# Patient Record
Sex: Female | Born: 1945 | Race: White | Hispanic: No | Marital: Married | Smoking: Never smoker
Health system: Southern US, Community
[De-identification: ages and names within clinical notes are randomized; demographics above are authoritative.]

## PROBLEM LIST (undated history)

## (undated) DIAGNOSIS — H269 Unspecified cataract: Secondary | ICD-10-CM

## (undated) DIAGNOSIS — F419 Anxiety disorder, unspecified: Secondary | ICD-10-CM

## (undated) HISTORY — PX: BLADDER SURGERY: SHX569

## (undated) HISTORY — PX: CHOLECYSTECTOMY: SHX55

## (undated) HISTORY — PX: REPLACEMENT TOTAL KNEE: SUR1224

## (undated) HISTORY — PX: RECTOCELE REPAIR: SHX761

## (undated) HISTORY — PX: APPENDECTOMY: SHX54

## (undated) HISTORY — PX: ULNAR NERVE REPAIR: SHX2594

## (undated) HISTORY — PX: OTHER SURGICAL HISTORY: SHX169

## (undated) HISTORY — DX: Anxiety disorder, unspecified: F41.9

## (undated) HISTORY — PX: HYSTERECTOMY ABDOMINAL WITH SALPINGECTOMY: SHX6725

## (undated) HISTORY — PX: CARPAL TUNNEL RELEASE: SHX101

## (undated) HISTORY — PX: HAND TENDON SURGERY: SHX663

## (undated) HISTORY — PX: TONSILLECTOMY: SUR1361

## (undated) HISTORY — DX: Unspecified cataract: H26.9

---

## 2015-12-03 ENCOUNTER — Ambulatory Visit (INDEPENDENT_AMBULATORY_CARE_PROVIDER_SITE_OTHER): Payer: Medicare Other | Admitting: Gastroenterology

## 2015-12-03 ENCOUNTER — Telehealth: Payer: Self-pay | Admitting: Gastroenterology

## 2015-12-03 ENCOUNTER — Other Ambulatory Visit: Payer: Self-pay

## 2015-12-03 ENCOUNTER — Encounter: Payer: Self-pay | Admitting: Gastroenterology

## 2015-12-03 DIAGNOSIS — IMO0002 Reserved for concepts with insufficient information to code with codable children: Secondary | ICD-10-CM

## 2015-12-03 DIAGNOSIS — M25559 Pain in unspecified hip: Secondary | ICD-10-CM

## 2015-12-03 DIAGNOSIS — R1032 Left lower quadrant pain: Secondary | ICD-10-CM

## 2015-12-03 DIAGNOSIS — N816 Rectocele: Secondary | ICD-10-CM

## 2015-12-03 NOTE — Telephone Encounter (Signed)
Pt called this afternoon asking if her insurance would cover her CT scan that's scheduled for 9/26. Please call [phone]

## 2015-12-03 NOTE — Progress Notes (Signed)
cc'ed to pcp °

## 2015-12-03 NOTE — Assessment & Plan Note (Addendum)
MOST LIKELY ASSOCIATED WITH CONSTIPATION AND PELVIC LAXITY,LESS LIKELY OCCULT MALIGNANCY.  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  DRINK WATER TO KEEP YOUR URINE LIGHT YELLOW.   FOLLOW A HIGH FIBER DIET. AVOID ITEMS THAT CAUSE BLOATING & GAS. HANDOUT GIVEN.  ADD LINZESS DAILY WITH BREAKFAST. IT MAY CAUSE EXPLOSIVE DIARRHEA.  COMPLETE CT SCAN.   OBTAIN MRI REPORT AND TCS REPORT.  FOLLOW UP IN 3 MOS.

## 2015-12-03 NOTE — Patient Instructions (Addendum)
Complete appointment at [facility].   DRINK WATER TO KEEP YOUR URINE LIGHT YELLOW.   FOLLOW A HIGH FIBER DIET. AVOID ITEMS THAT CAUSE BLOATING & GAS. SEE INFO BELOW.  ADD LINZESS DAILY WITH BREAKFAST. IT MAY CAUSE EXPLOSIVE DIARRHEA.  PLEASE CALL IN 7 DAYS IF YOUR SYMPTOMS ARE NOT IMPROVED.   COMPLETE CT SCAN.  FOLLOW UP IN 3 MOS.   High-Fiber Diet A high-fiber diet changes your normal diet to include more whole grains, legumes, fruits, and vegetables. Changes in the diet involve replacing refined carbohydrates with unrefined foods. The calorie level of the diet is essentially unchanged. The Dietary Reference Intake (recommended amount) for adult males is 38 grams per day. For adult females, it is 25 grams per day. Pregnant and lactating women should consume 28 grams of fiber per day. Fiber is the intact part of a plant that is not broken down during digestion. Functional fiber is fiber that has been isolated from the plant to provide a beneficial effect in the body. PURPOSE  Increase stool bulk.   Ease and regulate bowel movements.   Lower cholesterol.  INDICATIONS THAT YOU NEED MORE FIBER  Constipation and hemorrhoids.   Uncomplicated diverticulosis (intestine condition) and irritable bowel syndrome.   Weight management.   As a protective measure against hardening of the arteries (atherosclerosis), diabetes, and cancer.   GUIDELINES FOR INCREASING FIBER IN THE DIET  Start adding fiber to the diet slowly. A gradual increase of about 5 more grams (2 slices of whole-wheat bread, 2 servings of most fruits or vegetables, or 1 bowl of high-fiber cereal) per day is best. Too rapid an increase in fiber may result in constipation, flatulence, and bloating.   Drink enough water and fluids to keep your urine clear or pale yellow. Water, juice, or caffeine-free drinks are recommended. Not drinking enough fluid may cause constipation.   Eat a variety of high-fiber foods rather  than one type of fiber.   Try to increase your intake of fiber through using high-fiber foods rather than fiber pills or supplements that contain small amounts of fiber.   The goal is to change the types of food eaten. Do not supplement your present diet with high-fiber foods, but replace foods in your present diet.  INCLUDE A VARIETY OF FIBER SOURCES  Replace refined and processed grains with whole grains, canned fruits with fresh fruits, and incorporate other fiber sources. White rice, white breads, and most bakery goods contain little or no fiber.   Brown whole-grain rice, buckwheat oats, and many fruits and vegetables are all good sources of fiber. These include: broccoli, Brussels sprouts, cabbage, cauliflower, beets, sweet potatoes, white potatoes (skin on), carrots, tomatoes, eggplant, squash, berries, fresh fruits, and dried fruits.   Cereals appear to be the richest source of fiber. Cereal fiber is found in whole grains and bran. Bran is the fiber-rich outer coat of cereal grain, which is largely removed in refining. In whole-grain cereals, the bran remains. In breakfast cereals, the largest amount of fiber is found in those with "bran" in their names. The fiber content is sometimes indicated on the label.   You may need to include additional fruits and vegetables each day.   In baking, for 1 cup white flour, you may use the following substitutions:   1 cup whole-wheat flour minus 2 tablespoons.   1/2 cup white flour plus 1/2 cup whole-wheat flour.

## 2015-12-03 NOTE — Telephone Encounter (Signed)
Called pt and informed pre-authorization isn't needed for Medicare, but she may have to meet her deductible for year. She stated she thinks she has met her deductible.

## 2015-12-03 NOTE — Progress Notes (Signed)
Subjective:    Patient ID: Mary Bruce, female    DOB: [birth year], 70 y.o.   MRN: [id number] [name], MD  HPI Hurts in left POSTERIOR PELVIS. STARTS IN THE MIDDLE AND GOES TO LEFT SIDE AND THEN DOWN INTO HER RECTUM. MAY HAVE LLQ DISCOMFORT AS WELL. MAY BE TENDER IN LLQ. IF SHE HAS A GOOD BM SHE HAS PRESSURE ON HER POSTERIOR PELVIS. LAST COLONOSCOPY 3 YEARS: DR. Sabino Dick AND HAS HAD DIVERTICULITIS. DOESN'T FEEL LIKE SHE EMPTIES. HAS A LOT OF PRESSURE. PRESSURE HAPPENS EVERY DAY FOR 3 MOS. PCP PUT HER ON PREDNISONE AND AS SOON AS IT WEANED OFF THEN IT CAME BACK. DID MRI OF BACK IN [place] AND SAW BACK DOCTOR. NOT HAVING TROUBLE WITH THIS BACK PAIN AT THE TIME OF TCS, NO KNOWN HISTORY OF HEMORRHOIDS. PROBLEMS WITH SEDATION: HARD TO WAKE UP.  MEDS FOR CONSTIPATION: MIRALAX, SENNA, ??MEDS CAUSING BLOATING-NOW OFF MARKET  PT DENIES FEVER, CHILLS, HEMATOCHEZIA, HEMATEMESIS, nausea, vomiting, melena, diarrhea, CHEST PAIN, SHORTNESS OF BREATH,  CHANGE IN BOWEL IN HABITS, problems swallowing, OR heartburn or indigestion.  Past Medical History:  Diagnosis Date  . Anxiety disorder    AGE 70    Past Surgical History:  Procedure Laterality Date  . APPENDECTOMY     EMPIRIC  . BLADDER SURGERY     "BLADDER TACK"  . CARPAL TUNNEL RELEASE Right   . CHOLECYSTECTOMY     GALLSTINES  . HAND TENDON SURGERY Right   . HYSTERECTOMY ABDOMINAL WITH SALPINGECTOMY    . KIDNEY STONE    . RECTOCELE REPAIR    . REPLACEMENT TOTAL KNEE Right   . TONSILLECTOMY    . ULNAR NERVE REPAIR Right    Allergies  Allergen Reactions  . Flagyl [Metronidazole] Other (See Comments)    TONGUE FELT FUNNY  . Levaquin [Levofloxacin In D5w]     TONGUE FELT FUNNY  . Oxycodone Other (See Comments)    HALLUCINATIONS-SEEING BUGS  . Librax [Chlordiazepoxide-Clidinium] Rash    ON FACE    Current Outpatient Prescriptions  Medication Sig Dispense Refill  . ALPRAZolam (XANAX) 0.25 MG tablet Take 1  tablet by mouth as needed.    . calcium citrate-vitamin D (CITRACAL+D) 315-200 MG-UNIT tablet Take 2 tablets by mouth daily.    Marland Kitchen conjugated estrogens (PREMARIN) vaginal cream 1 Applicatorful 2 (two) times a week.    . Cyanocobalamin (VITAMIN B 12) 100 MCG LOZG Take 1 tablet by mouth daily.    Marland Kitchen estradiol (VIVELLE-DOT) 0.05 MG/24HR patch Place 1 patch onto the skin 2 (two) times a week.    Marland Kitchen ibuprofen (ADVIL,MOTRIN) 200 MG tablet Take 2 tablets by mouth as needed.    Marland Kitchen omega-3 fish oil (MAXEPA) 1000 MG CAPS capsule Take 1 capsule by mouth daily.    Marland Kitchen PARoxetine (PAXIL) 10 MG tablet Take 2 tablets by mouth daily.      Family History  Problem Relation Age of Onset  . Colon polyps Son   NO COLON CANCER  Social History   Social History  . Marital status: Married    Spouse name: N/A  . Number of children: N/A  . Years of education: N/A   Social History Main Topics  . Smoking status: Never Smoker  . Smokeless tobacco: Never Used  . Alcohol use None  . Drug use: Unknown  . Sexual activity: Not Asked   Review of Systems PER HPI OTHERWISE ALL SYSTEMS ARE NEGATIVE.     Objective:   Physical Exam  Constitutional:  She is oriented to person, place, and time. She appears well-developed and well-nourished. No distress.  HENT:  Head: Normocephalic and atraumatic.  Mouth/Throat: Oropharynx is clear and moist. No oropharyngeal exudate.  Eyes: Pupils are equal, round, and reactive to light. No scleral icterus.  Neck: Normal range of motion. Neck supple.  Cardiovascular: Normal rate, regular rhythm and normal heart sounds.   Pulmonary/Chest: Effort normal and breath sounds normal. No respiratory distress.  Abdominal: Soft. Bowel sounds are normal. She exhibits no distension. There is no tenderness.  Musculoskeletal: She exhibits no edema.  Lymphadenopathy:    She has no cervical adenopathy.  Neurological: She is alert and oriented to person, place, and time.  NO FOCAL DEFICITS    Psychiatric: She has a normal mood and affect.  Vitals reviewed.     Assessment & Plan:

## 2015-12-03 NOTE — Progress Notes (Signed)
ON RECALL  °

## 2015-12-04 LAB — CREATININE, SERUM: Creat: 0.74 mg/dL (ref 0.60–0.93)

## 2015-12-08 ENCOUNTER — Ambulatory Visit (HOSPITAL_COMMUNITY)
Admission: RE | Admit: 2015-12-08 | Discharge: 2015-12-08 | Disposition: A | Discharge status: Home / Self Care | Payer: Medicare Other | Source: Ambulatory Visit | Attending: Gastroenterology | Admitting: Gastroenterology

## 2015-12-08 ENCOUNTER — Encounter (HOSPITAL_COMMUNITY): Payer: Self-pay | Admitting: Radiology

## 2015-12-08 DIAGNOSIS — K5730 Diverticulosis of large intestine without perforation or abscess without bleeding: Secondary | ICD-10-CM | POA: Diagnosis not present

## 2015-12-08 DIAGNOSIS — N8189 Other female genital prolapse: Secondary | ICD-10-CM | POA: Diagnosis not present

## 2015-12-08 DIAGNOSIS — K838 Other specified diseases of biliary tract: Secondary | ICD-10-CM | POA: Diagnosis not present

## 2015-12-08 DIAGNOSIS — N8110 Cystocele, unspecified: Secondary | ICD-10-CM | POA: Diagnosis not present

## 2015-12-08 DIAGNOSIS — R1032 Left lower quadrant pain: Secondary | ICD-10-CM | POA: Insufficient documentation

## 2015-12-08 MED ORDER — IOPAMIDOL (ISOVUE-300) INJECTION 61%
100.0000 mL | Freq: Once | INTRAVENOUS | Status: AC | PRN
  Administered 2015-12-08: 100 mL via INTRAVENOUS

## 2015-12-10 ENCOUNTER — Telehealth: Payer: Self-pay | Admitting: Gastroenterology

## 2015-12-10 DIAGNOSIS — K838 Other specified diseases of biliary tract: Secondary | ICD-10-CM

## 2015-12-10 NOTE — Telephone Encounter (Signed)
Pt called to see if her CT results are back yet and she has concerns about the Urologist in [place] and wanted to speak with SF to get reassurance that he was the best urologist in [place]. Please call [phone] or [phone]

## 2015-12-11 NOTE — Addendum Note (Signed)
Addended by: West BaliFIELDS, Edie Vallandingham L on: 12/11/2015 12:39 PM   Modules accepted: Orders

## 2015-12-11 NOTE — Telephone Encounter (Addendum)
Called patient TO DISCUSS RESULTS. EXPLAINED TO HUSBAND. 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 PT SHOULD OPV IN OCT 9 130 PM WITH DR. MACDIARMID. HUSBAND AWARE OF ASSESSMENT AND PLAN. EXPLAINED PELVIC DYSFUNCTION, PROBLEMS WITH PELVIC LAXITY, AND POTENTIAL BENEFIT OF PHYSICAL THERAPY +/= SURGERY TO IMPROVE THE PT'S QUALITY OF LIFE/BOWEL MOVEMENTS. PT MAY CALL ON MON WITH QUESTIONS OR CONCERNS. NEED HFP TO FOLLOW UP DILATED CBD. HE IS AWARE IT'S NOT UNCOMMON TO SEE IF PT HAS HAD A GB REMOVED.   FAX HFP ORDER TO LAB.

## 2015-12-11 NOTE — Telephone Encounter (Signed)
I called pt and she is aware we have 7-10 business days to call on results. Her CT was done on 12/08/2015.  I told her not to worry that Dr. Darrick PennaFields would only send her to the best doctors and the Urologist group that she has been referred to is one of the very best. She said she felt better and she is sorry that she even questioned it, but she has just been worried. I told her Dr. Darrick PennaFields understands and no problem that she asked.  She said she felt much better since I had called her and she is fine now with the referral.

## 2015-12-14 ENCOUNTER — Other Ambulatory Visit: Payer: Self-pay

## 2015-12-14 DIAGNOSIS — K838 Other specified diseases of biliary tract: Secondary | ICD-10-CM

## 2015-12-15 ENCOUNTER — Telehealth: Payer: Self-pay

## 2015-12-15 MED ORDER — LINACLOTIDE 145 MCG PO CAPS: ORAL_CAPSULE | ORAL | 11 refills | Status: DC

## 2015-12-15 NOTE — Telephone Encounter (Signed)
Pt's husband is aware for pt to get the lab work. He is aware the order has been faxed to [facility] and they will try to do today or tomorrow.

## 2015-12-15 NOTE — Telephone Encounter (Signed)
PLEASE CALL PT.  Rx sent.  

## 2015-12-15 NOTE — Telephone Encounter (Signed)
Pt is aware.  

## 2015-12-15 NOTE — Telephone Encounter (Signed)
Pt called and said the Linzess is working well ( she thinks it was 145 mcg).  She would like a prescription sent to [facility] in [place].

## 2015-12-16 LAB — HEPATIC FUNCTION PANEL
ALBUMIN: 3.8 g/dL (ref 3.6–5.1)
ALK PHOS: 48 U/L (ref 33–130)
ALT: 11 U/L (ref 6–29)
AST: 16 U/L (ref 10–35)
Bilirubin, Direct: 0.1 mg/dL (ref <=0.2)
Indirect Bilirubin: 0.5 mg/dL (ref 0.2–1.2)
Total Bilirubin: 0.6 mg/dL (ref 0.2–1.2)
Total Protein: 5.8 g/dL — ABNORMAL LOW (ref 6.1–8.1)

## 2015-12-23 ENCOUNTER — Telehealth: Payer: Self-pay | Admitting: Gastroenterology

## 2015-12-23 NOTE — Telephone Encounter (Signed)
PLEASE CALL PT. HER LIVER PANEL IS ESSENTIALLY NORMAL.HER TOTAL PROTEIN IS LOW. SHE SHOULD SEE HER PCP TO CHECK HER URINE.

## 2015-12-23 NOTE — Telephone Encounter (Signed)
Pt is aware results. She has had her urine checked

## 2015-12-24 NOTE — Telephone Encounter (Signed)
Called pt. She is agreeable to appt to discuss TCS. Appt made for 01/07/16 at 9:00 am with [facility].

## 2015-12-24 NOTE — Telephone Encounter (Signed)
PLEASE CALL PT. IF SHE WOULD LIKE TO COME SEE ME TO TALK ABOUT THE BENEFITS V. RISKS OF A COLONOSCOPY THEN WE CAN MAKE AN APPT.

## 2015-12-24 NOTE — Telephone Encounter (Signed)
Called pt to set-up TCS but she advised she had a colonoscopy 3-4 years ago in [place] done by Dr. Elder CyphersShiflett who has since retired. Her PCP in [place] (Dr. Loni DollyJannach) at [facility] has her records. She has a colonoscopy every 5 years due to her son having hx of polyps. She said if she needs another colonoscopy now then she is willing to do it. She had her urine checked 12/21/15 when she went to see Dr. Sherron MondayMacdiarmid.

## 2015-12-24 NOTE — Telephone Encounter (Signed)
PLEASE CALL PT. I REVIEWED NOTES FROM UROLOGY. DR. Sherron MondayMACDIARMID RECOMMENDED A COLONOSCOPY. WE WILL GET HER SCHEDULED IN [place].   FULL LIQUIDS DIET DAY PRIOR TO TCS. CONTINUE LINZESS. TCS W/ MAC DUE TO POLYPHARMACY, Dx: RECTAL PAIN. IF NO SOURCE FOR HER RECTAL PAIN CAN BE IDENTIFIED THEN SHE WILL NEED TO SEE A ORTHOPEDIC SURGEON.

## 2016-01-07 ENCOUNTER — Ambulatory Visit (INDEPENDENT_AMBULATORY_CARE_PROVIDER_SITE_OTHER): Payer: Medicare Other | Admitting: Gastroenterology

## 2016-01-07 ENCOUNTER — Encounter: Payer: Self-pay | Admitting: Gastroenterology

## 2016-01-07 VITALS — BP 113/67 | HR 72 | Temp 97.8°F | Ht 64.0 in | Wt 172.4 lb

## 2016-01-07 DIAGNOSIS — K5901 Slow transit constipation: Secondary | ICD-10-CM

## 2016-01-07 DIAGNOSIS — R1032 Left lower quadrant pain: Secondary | ICD-10-CM | POA: Diagnosis not present

## 2016-01-07 MED ORDER — LINACLOTIDE 145 MCG PO CAPS: ORAL_CAPSULE | ORAL | 3 refills | Status: DC

## 2016-01-07 NOTE — Progress Notes (Signed)
ON RECALL  °

## 2016-01-07 NOTE — Progress Notes (Signed)
Subjective:    Patient ID: Mary Bruce, female    DOB: 11-15-1945, 70 y.o.   MRN: [id number]  Mary Deem, MD   HPI SAW UROLOGY AND NO INTERVENTION PLANNED. SAW DR. Jearl Bruce CHIROPRACTOR AND RECTAL PAIN IS BETTER WITH MS MANIPULATION. EXTENSIVE GI WORKUP-NO CAUSE FOR RECTAL PAIN. RECENT TCS 2014. HAS PMHx: POLYPS BUT NEEDS TCS Q5 YEARS DUE TO FAMILY HISTORY AND PERSONAL HISTORY OF POLYPS. CONSTIPATION IS BETTER. BM Q2-3 DAYS BUT NO STRAINING. LINZESS 145 MCG WORKS FOR CONSTIPATION. INTERESTED IN HIGHER DOSE IF IT WOULD BE MOR EFFFECTIVE. LLQ PAIN IS BETTER AFTER CHIROPRACTOR AND LINZESS.  PT DENIES FEVER, CHILLS, HEMATOCHEZIA, nausea, vomiting, diarrhea, CHEST PAIN, SHORTNESS OF BREATH,  abdominal pain, problems swallowing, problems with sedation, heartburn or indigestion.  Past Medical History:  Diagnosis Date  . Anxiety disorder    AGE 85  . Cataracts, bilateral    Past Surgical History:  Procedure Laterality Date  . APPENDECTOMY     EMPIRIC  . BLADDER SURGERY     "BLADDER TACK"  . CARPAL TUNNEL RELEASE Right   . CHOLECYSTECTOMY     GALLSTINES  . HAND TENDON SURGERY Right   . HYSTERECTOMY ABDOMINAL WITH SALPINGECTOMY     DUE TO "DROPPPED BLADDER"  . KIDNEY STONE    . RECTOCELE REPAIR    . REPLACEMENT TOTAL KNEE Right   . TONSILLECTOMY    . ULNAR NERVE REPAIR Right     Allergies  Allergen Reactions  . Flagyl [Metronidazole] Other (See Comments)    TONGUE FELT FUNNY  . Levaquin [Levofloxacin In D5w]     TONGUE FELT FUNNY  . Oxycodone Other (See Comments)    HALLUCINATIONS-SEEING BUGS  . Librax [Chlordiazepoxide-Clidinium] Rash    ON FACE    Current Outpatient Prescriptions  Medication Sig Dispense Refill  . ALPRAZolam (XANAX) 0.25 MG tablet Take 1 tablet by mouth as needed.    . calcium citrate-vitamin D (CITRACAL+D) 315-200 MG-UNIT tablet Take 2 tablets by mouth daily.    Marland Kitchen conjugated estrogens (PREMARIN) vaginal cream 1 Applicatorful 2 (two) times a  week.    . Cyanocobalamin (VITAMIN B 12) 100 MCG LOZG Take 1 tablet by mouth daily.    Marland Kitchen estradiol (VIVELLE-DOT) 0.05 MG/24HR patch Place 1 patch onto the skin 2 (two) times a week.    Marland Kitchen ibuprofen (ADVIL,MOTRIN) 200 MG tablet Take 2 tablets by mouth as needed.    . linaclotide (LINZESS) 145 MCG CAPS capsule 1 PO 30 mins prior to your first meal 30 capsule 11  . omega-3 fish oil (MAXEPA) 1000 MG CAPS capsule Take 1 capsule by mouth daily.    Marland Kitchen PARoxetine (PAXIL) 20 MG tablet Take 1 tablet by mouth daily.    Marland Kitchen Specialty Vitamins Products (MAGNESIUM, AMINO ACID CHELATE,) 133 MG tablet Take 4 tablets by mouth at bedtime. Unsure of dose    . PARoxetine (PAXIL) 10 MG tablet Take 2 tablets by mouth daily.     Review of Systems PER HPI OTHERWISE ALL SYSTEMS ARE NEGATIVE.    Objective:   Physical Exam  Constitutional: She is oriented to person, place, and time. She appears well-developed and well-nourished. No distress.  HENT:  Head: Normocephalic and atraumatic.  Mouth/Throat: Oropharynx is clear and moist. No oropharyngeal exudate.  Eyes: Pupils are equal, round, and reactive to light. No scleral icterus.  Neck: Normal range of motion. Neck supple.  Cardiovascular: Normal rate, regular rhythm and normal heart sounds.   Pulmonary/Chest: Effort normal and breath sounds  normal. No respiratory distress.  Abdominal: Soft. Bowel sounds are normal. She exhibits no distension. There is no tenderness.  Musculoskeletal: She exhibits no edema.  Lymphadenopathy:    She has no cervical adenopathy.  Neurological: She is alert and oriented to person, place, and time.  Psychiatric: She has a normal mood and affect.  Vitals reviewed.     Assessment & Plan:

## 2016-01-07 NOTE — Progress Notes (Signed)
cc'ed to pcp °

## 2016-01-07 NOTE — Assessment & Plan Note (Addendum)
SYMPTOMS FAIRLY WELL CONTROLLED AFTER LINZESS AND CHIROPRACTOR.  DRINK WATER EAT FIBER TAKE LINZESS. TITRATE DOSE TO EFFECT. HOLD FOR EXPLOSIVE DIARRHEA AND REDUCE TO 145 MCG. RX SENT FOR 90 DAY SUPPLY. SAMPLES OF 290 MCG LINZESS GIVEN(#8) FOLLOW UP IN 6 MOS.    GREATER THAN 50% WAS SPENT IN COUNSELING & COORDINATION OF CARE WITH THE PATIENT: DISCUSSED DIFFERENTIAL DIAGNOSIS, PROCEDURE, BENEFITS, SIDE EFFECTS OF LINZES, AND MANAGEMENT OF CONSTIPATION. TOTAL ENCOUNTER TIME: 25 MINS.

## 2016-01-07 NOTE — Assessment & Plan Note (Addendum)
SYMPTOMS NOT IDEALLY CONTROLLED ON LINZESS 145 MCG.  DRNK WATER EAT FIBER. USE FIBER GUMMIES, POWDER OR 1 PACKET ONCE OR TWICE TO PREVENT CONSTIPATION. AVOID HIGHER DOSES IF IT CAUSES BLOATING & GAS.  CONTINUE LINZESS. FIRST ADD FIBER AND TAKE THE LINZESS WITH BREAKFAST, NOT BEFORE. IF YOUR NOT HAVING A SATISFACTORY BOWEL MOVEMENT, INCREASE TO 290 MCG DAILY. HOLD FOR EXPLOSIVE DIARRHEA AND RE-START 145 MCG. RX SENT FOR 90 DAY SUPPLY OF 145 MCG LINZESS.  FOLLOW UP IN 6 MOS.

## 2016-01-07 NOTE — Patient Instructions (Addendum)
DRINK WATER TO KEEP YOUR URINE LIGHT YELLOW.  EAT FIBER.  AS WELL TO INCREASE FIBER IN YOUR DIET, USE FIBER GUMMIES, POWDER, OR 1 PACKET ONCE OR TWICE TO PREVENT CONSTIPATION. AVOID HIGHER DOSES IF IT CAUSES BLOATING & GAS.  CONTINUE LINZESS. FIRST ADD FIBER AND TAKE THE LINZESS 145 MCG PILLS WITH BREAKFAST, NOT BEFORE. IF YOUR NOT HAVING A SATISFACTORY BOWEL MOVEMENT, THEN INCREASE TO 290 MCG DAILY. HOLD FOR EXPLOSIVE DIARRHEA AND RE-START 145 MCG. I SENT YOUR REFILLS FOR 90 DAY SUPPLY OF 145 MCG LINZESS.   USE CARNATION INSTANT BREAKFAST WITH ALMOND MILK ONCE OR TWICE DAILY TO HELP WITH WEIGHT REDUCTION.    FOLLOW UP IN 6 MOS.

## 2016-05-17 ENCOUNTER — Encounter: Payer: Self-pay | Admitting: Gastroenterology

## 2016-05-25 ENCOUNTER — Telehealth: Payer: Self-pay | Admitting: Gastroenterology

## 2016-05-25 NOTE — Telephone Encounter (Signed)
Pt received a letter from us that it was time for her 6 month follow up with SF. She said she was doing fine and the medicine was helping. She didn't know if she still needed to come in or not. She would like to speak to nurse. [phone]

## 2016-05-27 NOTE — Telephone Encounter (Signed)
PT said she still has some problems with constipation, Linzess helps a lot.  I encouraged her to follow up as she was supposed to and let Dr. Darrick PennaFields know how she is doing and then if she needs to stretch out her appointments more, she can see what Dr. Darrick PennaFields says.  She was fine with that and call was transferred to Darl PikesSusan to schedule apt.

## 2016-05-30 NOTE — Telephone Encounter (Signed)
NEXT OPV OCT 2018 Dx: CONSTIPATION

## 2016-05-31 NOTE — Telephone Encounter (Signed)
Pt is aware OK to cancel the appt in April and nic for OCT.

## 2016-05-31 NOTE — Telephone Encounter (Signed)
ON RECALL  °

## 2016-06-16 ENCOUNTER — Telehealth: Payer: Self-pay | Admitting: Gastroenterology

## 2016-06-16 NOTE — Telephone Encounter (Signed)
PT is aware.

## 2016-06-16 NOTE — Telephone Encounter (Signed)
Pt wanted to make OV with SF and I offered her to come 06/20/2016 but she has to take her husband to [facility] that day. She is having diarrhea and thinks its from taking Linzess. She would like to know what SF would recommend and call her back. [phone] or [phone]

## 2016-06-16 NOTE — Telephone Encounter (Signed)
I called pt. She said the Linzess 145 mcg has been giving her some diarrhea, even if she took it every 2-3 days. She opened up the capsules this Am and sprinkled on her shredded wheat and she has had two normal BM's today. She is requesting something more natural that she can use, she does not like taking medications.  Please advise!

## 2016-06-16 NOTE — Telephone Encounter (Signed)
PLEASE CALL PT. She can try IBGARD 2 PILLS 1 OR 2 TIMES A DAY, WHICH IS CONCENTRATED PEPPERMINT OIL OR SHE CAN GO TO THE [facility] FOR A TEA THAT HELPS CONSTIPATION.

## 2016-07-07 ENCOUNTER — Ambulatory Visit: Payer: Medicare Other | Admitting: Gastroenterology

## 2016-10-26 ENCOUNTER — Encounter: Payer: Self-pay | Admitting: Gastroenterology

## 2016-11-03 ENCOUNTER — Telehealth: Payer: Self-pay | Admitting: Gastroenterology

## 2016-11-03 NOTE — Telephone Encounter (Signed)
Pt received a letter from [place] that it was time for her follow up. She said that she is doing well and didn't feel like she needed an appointment and will call us if something changes.

## 2016-11-03 NOTE — Telephone Encounter (Signed)
REVIEWED-NO ADDITIONAL RECOMMENDATIONS. 

## 2016-11-03 NOTE — Telephone Encounter (Signed)
Forwarding to Dr.Fields.  

## 2018-01-15 ENCOUNTER — Encounter: Payer: Self-pay | Admitting: Gastroenterology

## 2018-01-24 ENCOUNTER — Ambulatory Visit: Payer: Medicare Other

## 2018-01-28 IMAGING — CT CT ABD-PELV W/ CM
2 of 5 series · 16 of 46 positions shown, 18 images · IV contrast (iopamidol)
Comparison: None.

CLINICAL DATA: Left lower quadrant pain and posterior pelvic pain
for 3 months.

EXAM:
CT ABDOMEN AND PELVIS WITH CONTRAST
TECHNIQUE: Multidetector CT imaging of the abdomen and pelvis was performed
using the standard protocol following bolus administration of
intravenous contrast.
CONTRAST:  100mL [id number] IOPAMIDOL ([id number]) INJECTION 61%

[Series 2: axial st · axial · 0.70mm/px · z∈[+505,+885]mm · 13 of 86 slices shown, 15 images]
[im 5/86  soft-tissue]
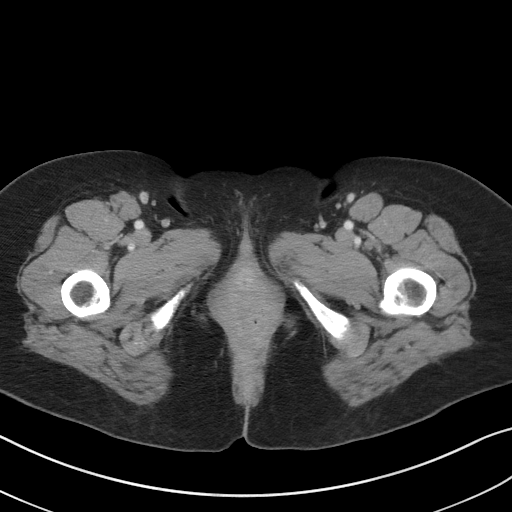
[im 5/86  bone]
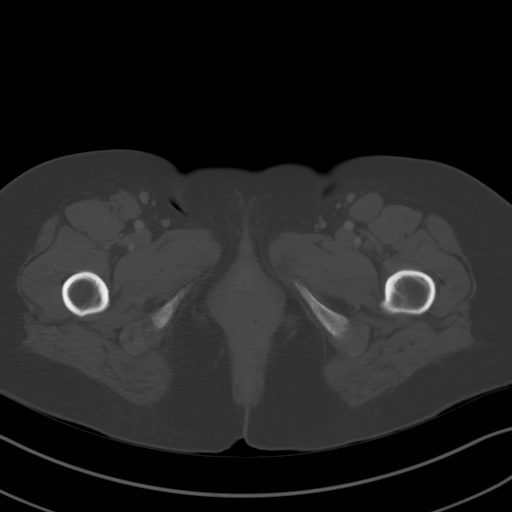
[im 10/86  soft-tissue]
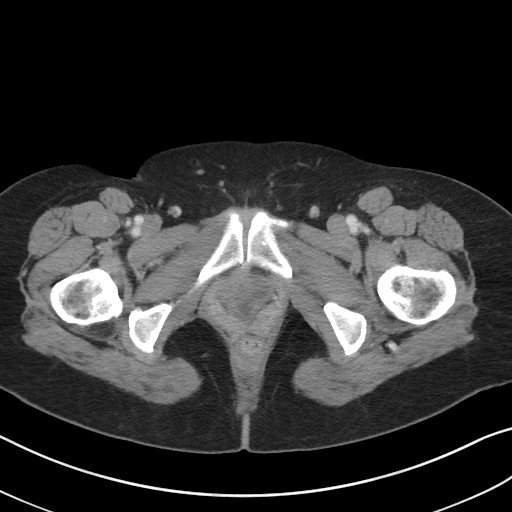
[im 19/86  soft-tissue]
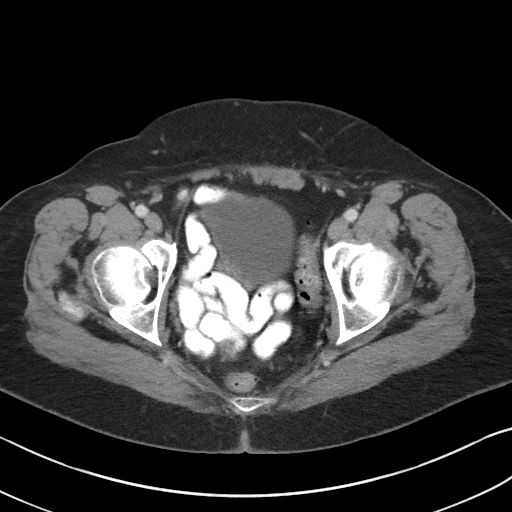
[im 24/86  soft-tissue]
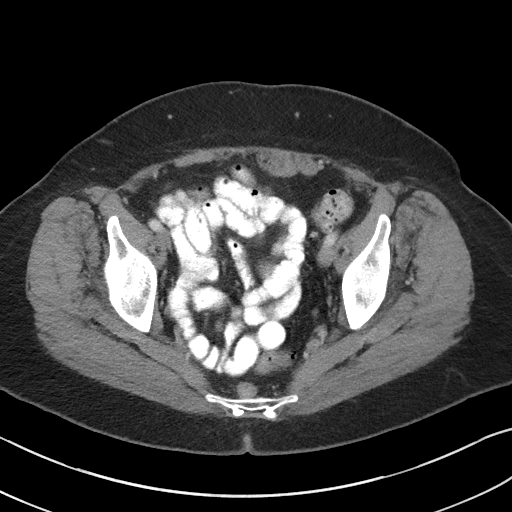
[im 29/86  soft-tissue]
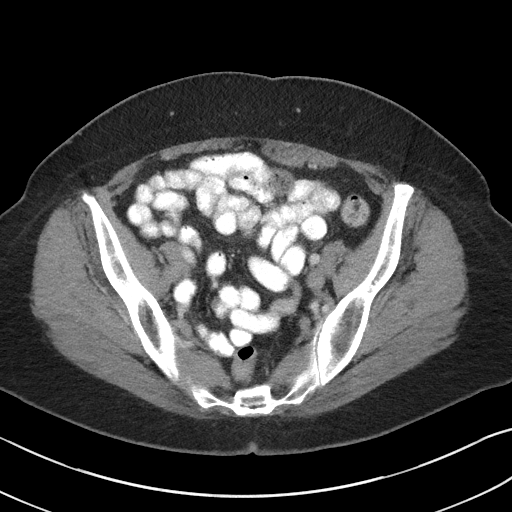
[im 38/86  soft-tissue]
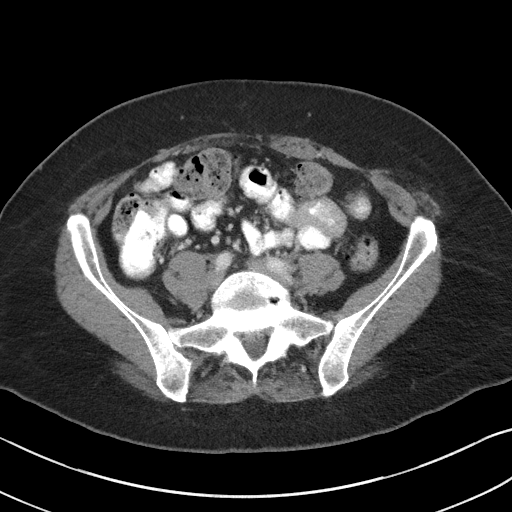
[im 43/86  soft-tissue]
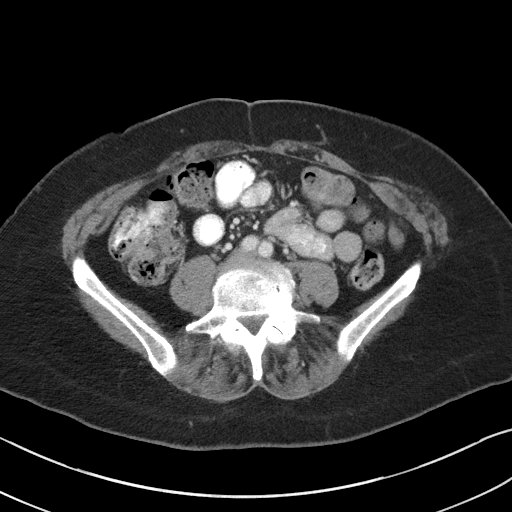
[im 48/86  soft-tissue]
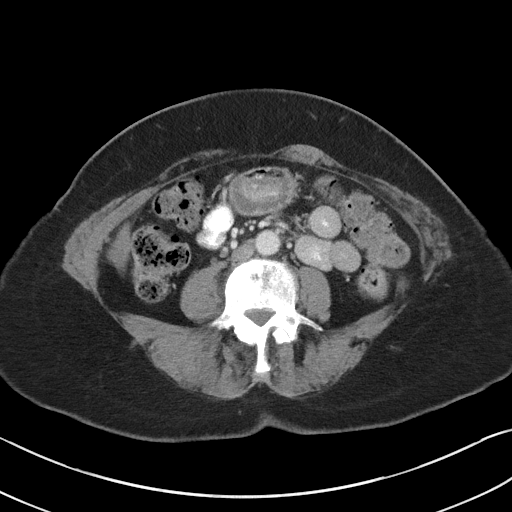
[im 57/86  soft-tissue]
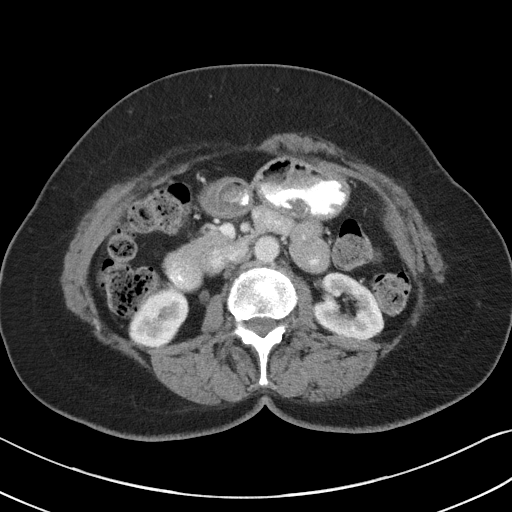
[im 57/86  bone]
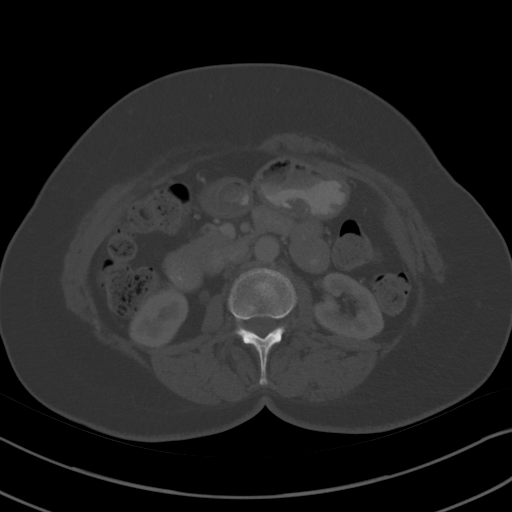
[im 62/86  soft-tissue]
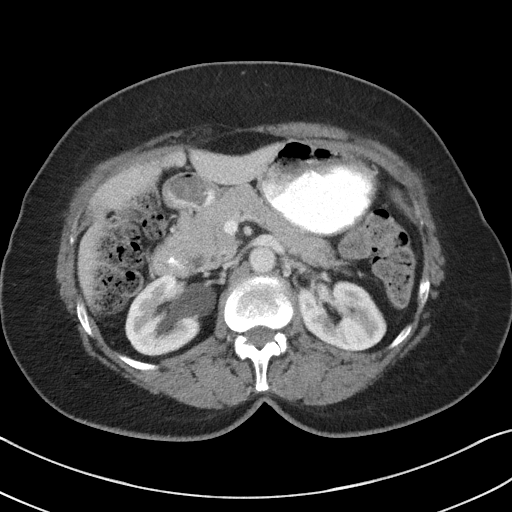
[im 67/86  soft-tissue]
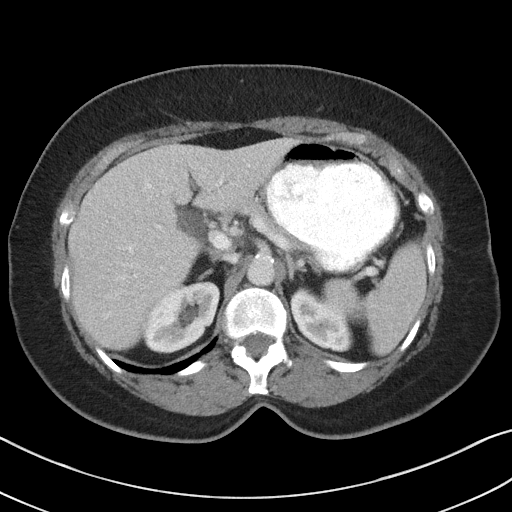
[im 76/86  soft-tissue]
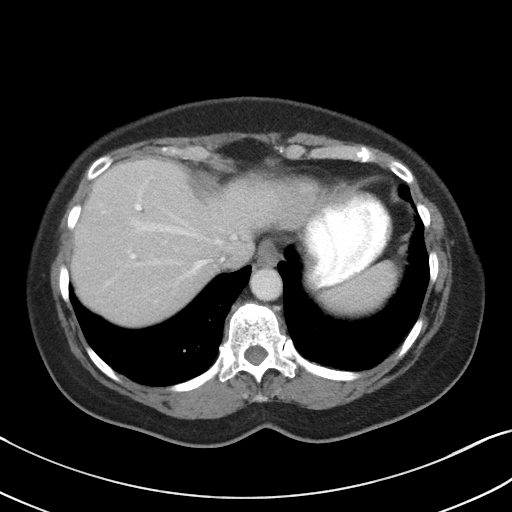
[im 81/86  soft-tissue]
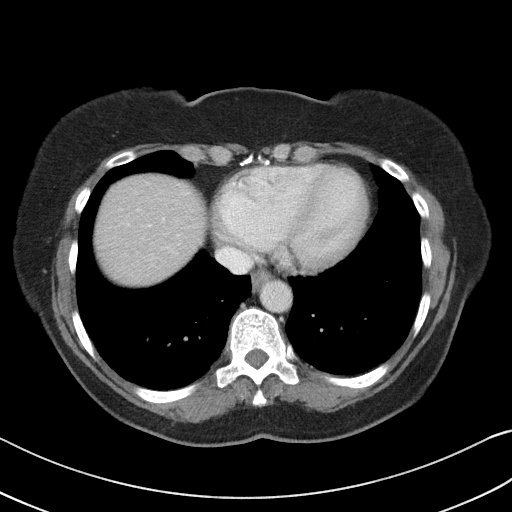

[Series 4: coronal st · coronal · 0.65mm/px · 3 of 91 slices shown]
[im 31/91  soft-tissue]
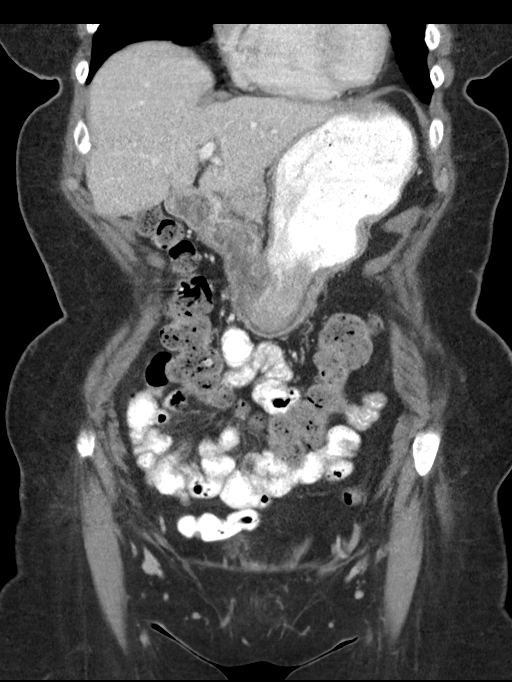
[im 41/91  soft-tissue]
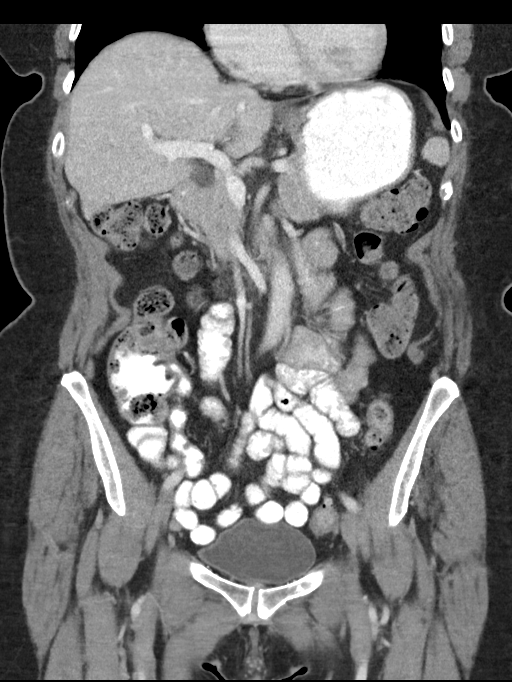
[im 51/91  soft-tissue]
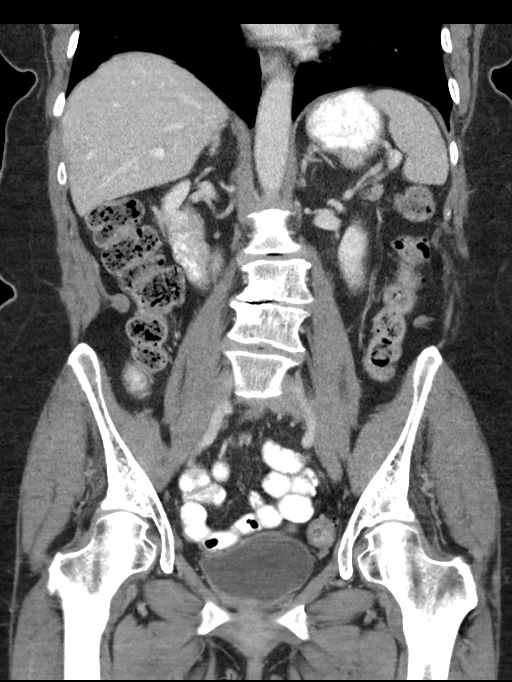

[16 of 46 positions shown; findings below may reference images not displayed]

FINDINGS: Lower Chest: No acute findings.

Hepatobiliary: No mass identified. Prior cholecystectomy. Mild
biliary ductal dilatation seen with common bile duct measuring 14 mm
in diameter. No obstructing etiology visualized by CT. This may be
due to prior cholecystectomy.

Pancreas: No mass or inflammatory changes. No evidence of pancreatic
ductal dilatation.

Spleen:  Unremarkable.

Adrenals/Urinary Tract: No masses identified. No evidence of
hydronephrosis. Pelvic floor laxity noted with small cystocele.
Otherwise unremarkable appearance of urinary bladder.

Stomach/Bowel: No evidence of obstruction, inflammatory process or
abnormal fluid collections. Mild sigmoid diverticulosis is noted,
without evidence of diverticulitis.

Vascular/Lymphatic: No pathologically enlarged lymph nodes. No
abdominal aortic aneurysm.

Reproductive: Prior hysterectomy noted. Adnexal regions are
unremarkable in appearance.

Other:  None.

Musculoskeletal: No suspicious bone lesions identified. Severe
degenerative disc disease noted at L3-4.
IMPRESSION: Mild biliary ductal dilatation, with common bile duct measuring 14
mm. No obstructing etiology visualized by CT. This may be related to
prior cholecystectomy. Recommend correlation with liver function
tests ; if abnormal, consider abdomen MRI and MRCP without and with
contrast.

Colonic diverticulosis. No radiographic evidence of diverticulitis.

Prior hysterectomy.  Mild pelvic floor laxity, with small cystocele.

## 2018-01-29 ENCOUNTER — Other Ambulatory Visit: Payer: Self-pay | Admitting: *Deleted

## 2018-01-29 ENCOUNTER — Ambulatory Visit (INDEPENDENT_AMBULATORY_CARE_PROVIDER_SITE_OTHER): Payer: Medicare Other | Admitting: Nurse Practitioner

## 2018-01-29 ENCOUNTER — Telehealth: Payer: Self-pay | Admitting: *Deleted

## 2018-01-29 ENCOUNTER — Encounter: Payer: Self-pay | Admitting: Nurse Practitioner

## 2018-01-29 DIAGNOSIS — R69 Illness, unspecified: Secondary | ICD-10-CM | POA: Diagnosis not present

## 2018-01-29 DIAGNOSIS — Z8371 Family history of colonic polyps: Secondary | ICD-10-CM | POA: Diagnosis not present

## 2018-01-29 MED ORDER — NA SULFATE-K SULFATE-MG SULF 17.5-3.13-1.6 GM/177ML PO SOLN: 1.0000 | ORAL | 0 refills | Status: AC

## 2018-01-29 NOTE — Telephone Encounter (Signed)
Patient called back. She is scheduled for TCS W/ SLF W/ MAC on 05/01/17 at 9:00am. Instructions will be mailed (confirmed mailing address), prep sent into the pharmacy. Will also mail pre-op letter.

## 2018-01-29 NOTE — Patient Instructions (Signed)
1. We will schedule your procedure for you. 2. Further recommendations will be made based on the results of your procedure. 3. Return for follow-up as needed, or based on the results of your procedure. 4. Call us if you have any questions or concerns.  At [facility] we value your feedback. You may receive a survey about your visit today. Please share your experience as we strive to create trusting relationships with our patients to provide genuine, compassionate, quality care.  We appreciate your understanding and patience as we review any laboratory studies, imaging, and other diagnostic tests that are ordered as we care for you. Our office policy is 5 business days for review of these results, and any emergent or urgent results are addressed in a timely manner for your best interest. If you do not hear from our office in 1 week, please contact us.   We also encourage the use of MyChart, which contains your medical information for your review as well. If you are not enrolled in this feature, an access code is on this after visit summary for your convenience. Thank you for allowing us to be involved in your care.  It was great to meet you today!  I hope you have a Happy Thanksgiving!!

## 2018-01-29 NOTE — Telephone Encounter (Signed)
OPENED IN ERROR

## 2018-01-29 NOTE — Progress Notes (Signed)
Primary Care Physician:  [name], MD Primary Gastroenterologist:  Dr. Darrick Penna  Chief Complaint  Patient presents with  . Colonoscopy    last tcs 5 yrs ago    HPI:   Mary Bruce is a 72 y.o. female who presents to schedule colonoscopy.  Nurse triage was deferred to office visit due to medications and previous issue with anesthesia.  Previous colonoscopy completed 11/22/2007 which found no polyps.  Recommended 5-year recheck.  Today he states she's never had polyps before. However, her son has had polyps. She isn't sure why she was told to have a 5 year repeat. Denies abdominal pain, N/V, fever, chills, unintentional weight loss, hematochezia, melena. Denies chest pain, dyspnea, dizziness, lightheadedness, syncope, near syncope. Denies any other upper or lower GI symptoms.  States she hasn't had a significant sedation issue. Did have difficulty waking up after surgical/general anesthesia. Has never issues with colonoscopy sedation.  Past Medical History:  Diagnosis Date  . Anxiety disorder    AGE 53  . Cataracts, bilateral     Past Surgical History:  Procedure Laterality Date  . APPENDECTOMY     EMPIRIC  . BLADDER SURGERY     "BLADDER TACK"  . CARPAL TUNNEL RELEASE Right   . CHOLECYSTECTOMY     GALLSTINES  . HAND TENDON SURGERY Right   . HYSTERECTOMY ABDOMINAL WITH SALPINGECTOMY     DUE TO "DROPPPED BLADDER"  . KIDNEY STONE    . RECTOCELE REPAIR    . REPLACEMENT TOTAL KNEE Right   . TONSILLECTOMY    . ULNAR NERVE REPAIR Right     Current Outpatient Medications  Medication Sig Dispense Refill  . ALPRAZolam (XANAX) 0.25 MG tablet Take 1 tablet by mouth as needed.    . calcium citrate-vitamin D (CITRACAL+D) 315-200 MG-UNIT tablet Take 2 tablets by mouth daily.    Marland Kitchen conjugated estrogens (PREMARIN) vaginal cream 1 Applicatorful 2 (two) times a week.    . Cyanocobalamin (VITAMIN B 12) 100 MCG LOZG Take 1 tablet by mouth daily.    Marland Kitchen estradiol (VIVELLE-DOT) 0.05  MG/24HR patch Place 1 patch onto the skin 2 (two) times a week.    . gabapentin (NEURONTIN) 100 MG capsule Take 100-200 mg by mouth as needed.    Marland Kitchen ibuprofen (ADVIL,MOTRIN) 200 MG tablet Take 2 tablets by mouth as needed.    Marland Kitchen omega-3 fish oil (MAXEPA) 1000 MG CAPS capsule Take 1 capsule by mouth daily.    Marland Kitchen PARoxetine (PAXIL) 20 MG tablet Take 1 tablet by mouth daily.    . SENNA PO Take by mouth as needed.    Marland Kitchen VITAMIN D PO Take by mouth. Unsure of strength; doesn't take everyday     No current facility-administered medications for this visit.     Allergies as of 01/29/2018 - Review Complete 01/29/2018  Allergen Reaction Noted  . Flagyl [metronidazole] Other (See Comments) 12/03/2015  . Levaquin [levofloxacin in d5w]  12/03/2015  . Oxycodone Other (See Comments) 12/03/2015  . Librax [chlordiazepoxide-clidinium] Rash 12/03/2015    Family History  Problem Relation Age of Onset  . Colon polyps Son   . Colon cancer Neg Hx     Social History   Socioeconomic History  . Marital status: Married    Spouse name: Not on file  . Number of children: Not on file  . Years of education: Not on file  . Highest education level: Not on file  Occupational History  . Not on file  Social Needs  .  Financial resource strain: Not on file  . Food insecurity:    Worry: Not on file    Inability: Not on file  . Transportation needs:    Medical: Not on file    Non-medical: Not on file  Tobacco Use  . Smoking status: Never Smoker  . Smokeless tobacco: Never Used  Substance and Sexual Activity  . Alcohol use: Never    Frequency: Never  . Drug use: Never  . Sexual activity: Not on file  Lifestyle  . Physical activity:    Days per week: Not on file    Minutes per session: Not on file  . Stress: Not on file  Relationships  . Social connections:    Talks on phone: Not on file    Gets together: Not on file    Attends religious service: Not on file    Active member of club or organization:  Not on file    Attends meetings of clubs or organizations: Not on file    Relationship status: Not on file  . Intimate partner violence:    Fear of current or ex partner: Not on file    Emotionally abused: Not on file    Physically abused: Not on file    Forced sexual activity: Not on file  Other Topics Concern  . Not on file  Social History Narrative  . Not on file    Review of Systems: Complete ROS negative except as per HPI.    Physical Exam: BP 116/69   Pulse 70   Temp 97.6 F (36.4 C) (Oral)   Ht 5\' 4"  (1.626 m)   Wt 176 lb 9.6 oz (80.1 kg)   BMI 30.31 kg/m  General:   Alert and oriented. Pleasant and cooperative. Well-nourished and well-developed.  Eyes:  Without icterus, sclera clear and conjunctiva pink.  Ears:  Normal auditory acuity. Cardiovascular:  S1, S2 present without murmurs appreciated. Extremities without clubbing or edema. Respiratory:  Clear to auscultation bilaterally. No wheezes, rales, or rhonchi. No distress.  Gastrointestinal:  +BS, soft, non-tender and non-distended. No HSM noted. No guarding or rebound. No masses appreciated.  Rectal:  Deferred  Musculoskalatal:  Symmetrical without gross deformities. Neurologic:  Alert and oriented x4;  grossly normal neurologically. Psych:  Alert and cooperative. Normal mood and affect. Heme/Lymph/Immune: No excessive bruising noted.    01/29/2018 12:20 PM   Disclaimer: This note was dictated with voice recognition software. Similar sounding words can inadvertently be transcribed and may not be corrected upon review.

## 2018-01-29 NOTE — Telephone Encounter (Signed)
Pre-op scheduled for 04/24/17 at 11:00am. Letter mailed with instructions.

## 2018-01-29 NOTE — Assessment & Plan Note (Signed)
Previous colonoscopy was 5 years ago.  The patient has never had polyps.  However, her son has had polyps and she thinks this is why they recommended a 5-year repeat exam.  At this point we will proceed based on previous endoscopist recommendation.  Further recommendations will follow based on colonoscopy results.  Return for follow-up as needed or based on post procedure recommendations.  Proceed with TCS on propofol/MAC with Dr. Gala Romney in near future: the risks, benefits, and alternatives have been discussed with the patient in detail. The patient states understanding and desires to proceed.  The patient is currently on Xanax, Neurontin, Paxil.  No other anticoagulants, anxiolytics, chronic pain medications, or antidepressants.  We will plan for the procedure on propofol/MAC to promote adequate sedation.

## 2018-01-29 NOTE — Telephone Encounter (Signed)
LMOVM for pt to schedule TCS w/ propofol w/ SLF

## 2018-01-29 NOTE — Assessment & Plan Note (Signed)
Patient is currently on multiple medications for chronic disease.  This includes some sedating medications.  We will plan for the procedure on propofol/MAC to promote adequate sedation in the setting.

## 2018-01-29 NOTE — Progress Notes (Signed)
CC'D TO PCP °

## 2018-01-31 ENCOUNTER — Telehealth: Payer: Self-pay | Admitting: Gastroenterology

## 2018-01-31 NOTE — Telephone Encounter (Signed)
Pt is scheduled colonoscopy with SF on 2/18. She has questions about her insurance. [phone]

## 2018-01-31 NOTE — Telephone Encounter (Signed)
Patient aware she will need to call insurance to see what her deductible is

## 2018-04-10 ENCOUNTER — Telehealth: Payer: Self-pay | Admitting: Gastroenterology

## 2018-04-10 NOTE — Telephone Encounter (Signed)
Called endo and cancelled procedure. FYI to EG 

## 2018-04-10 NOTE — Telephone Encounter (Signed)
Patient called and said to cancel her procedure and she will reschedule later

## 2018-04-11 NOTE — Telephone Encounter (Signed)
Noted  

## 2018-04-24 ENCOUNTER — Other Ambulatory Visit (HOSPITAL_COMMUNITY): Payer: Medicare Other

## 2018-05-01 ENCOUNTER — Ambulatory Visit (HOSPITAL_COMMUNITY): Admit: 2018-05-01 | Payer: Medicare Other | Admitting: Gastroenterology

## 2018-05-01 ENCOUNTER — Encounter (HOSPITAL_COMMUNITY): Payer: Self-pay

## 2018-05-01 SURGERY — COLONOSCOPY WITH PROPOFOL
Anesthesia: Monitor Anesthesia Care
